# Patient Record
Sex: Female | Born: 1980
Health system: Southern US, Community
[De-identification: ages and names within clinical notes are randomized; demographics above are authoritative.]

## PROBLEM LIST (undated history)

## (undated) DIAGNOSIS — Z9889 Other specified postprocedural states: Secondary | ICD-10-CM

## (undated) DIAGNOSIS — R112 Nausea with vomiting, unspecified: Secondary | ICD-10-CM

## (undated) DIAGNOSIS — R002 Palpitations: Secondary | ICD-10-CM

## (undated) HISTORY — DX: Palpitations: R00.2

---
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
## 2005-08-14 ENCOUNTER — Other Ambulatory Visit: Admission: RE | Admit: 2005-08-14 | Discharge: 2005-08-14 | Payer: Self-pay | Admitting: Gynecology

## 2006-05-16 ENCOUNTER — Other Ambulatory Visit: Admission: RE | Admit: 2006-05-16 | Discharge: 2006-05-16 | Payer: Self-pay | Admitting: Gynecology

## 2006-12-08 ENCOUNTER — Inpatient Hospital Stay (HOSPITAL_COMMUNITY): Admission: AD | Admit: 2006-12-08 | Discharge: 2006-12-11 | Payer: Self-pay | Admitting: Gynecology

## 2006-12-09 ENCOUNTER — Encounter (INDEPENDENT_AMBULATORY_CARE_PROVIDER_SITE_OTHER): Payer: Self-pay | Admitting: *Deleted

## 2007-01-09 ENCOUNTER — Other Ambulatory Visit: Admission: RE | Admit: 2007-01-09 | Discharge: 2007-01-09 | Payer: Self-pay | Admitting: Gynecology

## 2008-01-14 ENCOUNTER — Other Ambulatory Visit: Admission: RE | Admit: 2008-01-14 | Discharge: 2008-01-14 | Payer: Self-pay | Admitting: Gynecology

## 2009-01-28 ENCOUNTER — Other Ambulatory Visit: Admission: RE | Admit: 2009-01-28 | Discharge: 2009-01-28 | Payer: Self-pay | Admitting: Gynecology

## 2009-01-28 ENCOUNTER — Encounter: Payer: Self-pay | Admitting: Gynecology

## 2009-01-28 ENCOUNTER — Ambulatory Visit: Payer: Self-pay | Admitting: Gynecology

## 2009-08-27 ENCOUNTER — Ambulatory Visit: Payer: Self-pay | Admitting: Gynecology

## 2009-09-17 ENCOUNTER — Ambulatory Visit: Payer: Self-pay | Admitting: Gynecology

## 2009-10-01 ENCOUNTER — Ambulatory Visit: Payer: Self-pay | Admitting: Gynecology

## 2009-11-19 ENCOUNTER — Inpatient Hospital Stay (HOSPITAL_COMMUNITY): Admission: AD | Admit: 2009-11-19 | Discharge: 2009-11-19 | Payer: Self-pay | Admitting: Obstetrics and Gynecology

## 2010-01-04 ENCOUNTER — Ambulatory Visit (HOSPITAL_COMMUNITY): Admission: RE | Admit: 2010-01-04 | Discharge: 2010-01-04 | Payer: Self-pay | Admitting: Obstetrics and Gynecology

## 2010-02-15 ENCOUNTER — Ambulatory Visit (HOSPITAL_COMMUNITY): Admission: RE | Admit: 2010-02-15 | Discharge: 2010-02-15 | Payer: Self-pay | Admitting: Obstetrics and Gynecology

## 2010-03-23 ENCOUNTER — Inpatient Hospital Stay (HOSPITAL_COMMUNITY): Admission: AD | Admit: 2010-03-23 | Discharge: 2010-03-23 | Payer: Self-pay | Admitting: Obstetrics and Gynecology

## 2010-03-24 ENCOUNTER — Inpatient Hospital Stay (HOSPITAL_COMMUNITY): Admission: AD | Admit: 2010-03-24 | Discharge: 2010-03-24 | Payer: Self-pay | Admitting: Obstetrics and Gynecology

## 2010-04-29 ENCOUNTER — Encounter (INDEPENDENT_AMBULATORY_CARE_PROVIDER_SITE_OTHER): Payer: Self-pay | Admitting: Obstetrics & Gynecology

## 2010-04-29 ENCOUNTER — Inpatient Hospital Stay (HOSPITAL_COMMUNITY): Admission: AD | Admit: 2010-04-29 | Discharge: 2010-05-01 | Payer: Self-pay | Admitting: Obstetrics and Gynecology

## 2010-12-31 LAB — CBC
HCT: 31.1 % — ABNORMAL LOW (ref 36.0–46.0)
HCT: 35.6 % — ABNORMAL LOW (ref 36.0–46.0)
Hemoglobin: 12.3 g/dL (ref 12.0–15.0)
MCHC: 34.6 g/dL (ref 30.0–36.0)
MCHC: 34.9 g/dL (ref 30.0–36.0)
MCV: 98.7 fL (ref 78.0–100.0)
MCV: 99.2 fL (ref 78.0–100.0)
RBC: 3.14 MIL/uL — ABNORMAL LOW (ref 3.87–5.11)
RBC: 3.61 MIL/uL — ABNORMAL LOW (ref 3.87–5.11)
RDW: 13.2 % (ref 11.5–15.5)
WBC: 10.6 10*3/uL — ABNORMAL HIGH (ref 4.0–10.5)

## 2010-12-31 LAB — RPR: RPR Ser Ql: NONREACTIVE

## 2011-01-04 LAB — URINALYSIS, ROUTINE W REFLEX MICROSCOPIC
Nitrite: NEGATIVE
Protein, ur: NEGATIVE mg/dL
Specific Gravity, Urine: <1.005 — ABNORMAL LOW (ref 1.005–1.030)
Urobilinogen, UA: 0.2 mg/dL (ref 0.0–1.0)
pH: 6.5 (ref 5.0–8.0)

## 2011-01-04 LAB — URINE MICROSCOPIC-ADD ON

## 2011-06-05 ENCOUNTER — Encounter: Payer: Self-pay | Admitting: *Deleted

## 2011-06-05 DIAGNOSIS — K5900 Constipation, unspecified: Secondary | ICD-10-CM | POA: Insufficient documentation

## 2011-06-05 DIAGNOSIS — Z6740 Type O blood, Rh positive: Secondary | ICD-10-CM | POA: Insufficient documentation

## 2011-06-12 ENCOUNTER — Encounter: Payer: Self-pay | Admitting: Gynecology

## 2011-10-17 NOTE — L&D Delivery Note (Signed)
Delivery Note At 5:13 PM a viable female was delivered via  OA Presentation  APGAR: 9 9 weight pending  Placenta status: spontaneously, intact with 3 vessel  cord Cord:  with the following complications: none  Anesthesia:  epidural Episiotomy: none Lacerations: none Suture Repair: none Est. Blood Loss (mL): 300  Mom to postpartum.  Baby to nursery-stable.  Connie Richardson 01/02/2012, 5:29 PM

## 2011-12-26 LAB — STREP B DNA PROBE: GBS: NEGATIVE

## 2012-01-02 ENCOUNTER — Encounter (HOSPITAL_COMMUNITY): Payer: Self-pay | Admitting: *Deleted

## 2012-01-02 ENCOUNTER — Inpatient Hospital Stay (HOSPITAL_COMMUNITY): Payer: Managed Care, Other (non HMO) | Admitting: Anesthesiology

## 2012-01-02 ENCOUNTER — Encounter (HOSPITAL_COMMUNITY): Payer: Self-pay | Admitting: Anesthesiology

## 2012-01-02 ENCOUNTER — Inpatient Hospital Stay (HOSPITAL_COMMUNITY)
Admission: AD | Admit: 2012-01-02 | Discharge: 2012-01-04 | DRG: 774 | Disposition: A | Discharge status: Home / Self Care | Payer: Managed Care, Other (non HMO) | Source: Ambulatory Visit | Attending: Obstetrics and Gynecology | Admitting: Obstetrics and Gynecology

## 2012-01-02 DIAGNOSIS — Z6740 Type O blood, Rh positive: Secondary | ICD-10-CM

## 2012-01-02 DIAGNOSIS — T4145XA Adverse effect of unspecified anesthetic, initial encounter: Secondary | ICD-10-CM | POA: Insufficient documentation

## 2012-01-02 HISTORY — DX: Nausea with vomiting, unspecified: R11.2

## 2012-01-02 HISTORY — DX: Other specified postprocedural states: Z98.890

## 2012-01-02 LAB — CBC
MCHC: 34.5 g/dL (ref 30.0–36.0)
Platelets: 254 10*3/uL (ref 150–400)
RDW: 13.8 % (ref 11.5–15.5)
WBC: 9.3 10*3/uL (ref 4.0–10.5)

## 2012-01-02 LAB — GC/CHLAMYDIA PROBE AMP, GENITAL
Chlamydia: NEGATIVE
Gonorrhea: NEGATIVE

## 2012-01-02 LAB — ANTIBODY SCREEN: Antibody Screen: NEGATIVE

## 2012-01-02 LAB — HIV ANTIBODY (ROUTINE TESTING W REFLEX): HIV: NONREACTIVE

## 2012-01-02 MED ORDER — IBUPROFEN 600 MG PO TABS
600.0000 mg | ORAL_TABLET | Freq: Four times a day (QID) | ORAL | Status: DC
  Administered 2012-01-02 – 2012-01-04 (×8): 600 mg via ORAL
  Filled 2012-01-02 (×8): qty 1

## 2012-01-02 MED ORDER — OXYTOCIN 20 UNITS IN LACTATED RINGERS INFUSION - SIMPLE: 125.0000 mL/h | Freq: Once | INTRAVENOUS | Status: DC

## 2012-01-02 MED ORDER — IBUPROFEN 600 MG PO TABS: 600.0000 mg | ORAL_TABLET | Freq: Four times a day (QID) | ORAL | Status: DC | PRN

## 2012-01-02 MED ORDER — FLEET ENEMA 7-19 GM/118ML RE ENEM: 1.0000 | ENEMA | Freq: Every day | RECTAL | Status: DC | PRN

## 2012-01-02 MED ORDER — ONDANSETRON HCL 4 MG PO TABS: 4.0000 mg | ORAL_TABLET | ORAL | Status: DC | PRN

## 2012-01-02 MED ORDER — LIDOCAINE HCL (PF) 1 % IJ SOLN
30.0000 mL | INTRAMUSCULAR | Status: DC | PRN
  Filled 2012-01-02: qty 30

## 2012-01-02 MED ORDER — PRENATAL MULTIVITAMIN CH
1.0000 | ORAL_TABLET | Freq: Every day | ORAL | Status: DC
  Administered 2012-01-03 – 2012-01-04 (×2): 1 via ORAL
  Filled 2012-01-02 (×2): qty 1

## 2012-01-02 MED ORDER — SIMETHICONE 80 MG PO CHEW: 80.0000 mg | CHEWABLE_TABLET | ORAL | Status: DC | PRN

## 2012-01-02 MED ORDER — LANOLIN HYDROUS EX OINT: TOPICAL_OINTMENT | CUTANEOUS | Status: DC | PRN

## 2012-01-02 MED ORDER — MEASLES, MUMPS & RUBELLA VAC ~~LOC~~ INJ
0.5000 mL | INJECTION | Freq: Once | SUBCUTANEOUS | Status: DC
  Filled 2012-01-02: qty 0.5

## 2012-01-02 MED ORDER — TETANUS-DIPHTH-ACELL PERTUSSIS 5-2.5-18.5 LF-MCG/0.5 IM SUSP: 0.5000 mL | Freq: Once | INTRAMUSCULAR | Status: DC

## 2012-01-02 MED ORDER — EPHEDRINE 5 MG/ML INJ
10.0000 mg | INTRAVENOUS | Status: DC | PRN
  Filled 2012-01-02: qty 4

## 2012-01-02 MED ORDER — LACTATED RINGERS IV SOLN: INTRAVENOUS | Status: DC

## 2012-01-02 MED ORDER — PHENYLEPHRINE 40 MCG/ML (10ML) SYRINGE FOR IV PUSH (FOR BLOOD PRESSURE SUPPORT): 80.0000 ug | PREFILLED_SYRINGE | INTRAVENOUS | Status: DC | PRN

## 2012-01-02 MED ORDER — BISACODYL 10 MG RE SUPP: 10.0000 mg | Freq: Every day | RECTAL | Status: DC | PRN

## 2012-01-02 MED ORDER — OXYTOCIN BOLUS FROM INFUSION
500.0000 mL | Freq: Once | INTRAVENOUS | Status: DC
  Filled 2012-01-02: qty 1000
  Filled 2012-01-02: qty 500

## 2012-01-02 MED ORDER — METHYLERGONOVINE MALEATE 0.2 MG PO TABS
0.2000 mg | ORAL_TABLET | ORAL | Status: AC | PRN
  Administered 2012-01-02 – 2012-01-03 (×6): 0.2 mg via ORAL
  Filled 2012-01-02 (×6): qty 1

## 2012-01-02 MED ORDER — LACTATED RINGERS IV SOLN: 500.0000 mL | Freq: Once | INTRAVENOUS | Status: DC

## 2012-01-02 MED ORDER — OXYCODONE-ACETAMINOPHEN 5-325 MG PO TABS
1.0000 | ORAL_TABLET | ORAL | Status: DC | PRN
  Administered 2012-01-02 – 2012-01-03 (×4): 1 via ORAL
  Filled 2012-01-02 (×4): qty 1

## 2012-01-02 MED ORDER — EPHEDRINE 5 MG/ML INJ: 10.0000 mg | INTRAVENOUS | Status: DC | PRN

## 2012-01-02 MED ORDER — SENNOSIDES-DOCUSATE SODIUM 8.6-50 MG PO TABS
2.0000 | ORAL_TABLET | Freq: Every day | ORAL | Status: DC
  Administered 2012-01-03: 2 via ORAL

## 2012-01-02 MED ORDER — CITRIC ACID-SODIUM CITRATE 334-500 MG/5ML PO SOLN: 30.0000 mL | ORAL | Status: DC | PRN

## 2012-01-02 MED ORDER — ONDANSETRON HCL 4 MG/2ML IJ SOLN: 4.0000 mg | Freq: Four times a day (QID) | INTRAMUSCULAR | Status: DC | PRN

## 2012-01-02 MED ORDER — FENTANYL 2.5 MCG/ML BUPIVACAINE 1/10 % EPIDURAL INFUSION (WH - ANES)
14.0000 mL/h | INTRAMUSCULAR | Status: DC
  Filled 2012-01-02: qty 60

## 2012-01-02 MED ORDER — OXYCODONE-ACETAMINOPHEN 5-325 MG PO TABS: 1.0000 | ORAL_TABLET | ORAL | Status: DC | PRN

## 2012-01-02 MED ORDER — ACETAMINOPHEN 325 MG PO TABS: 650.0000 mg | ORAL_TABLET | ORAL | Status: DC | PRN

## 2012-01-02 MED ORDER — DIPHENHYDRAMINE HCL 50 MG/ML IJ SOLN: 12.5000 mg | INTRAMUSCULAR | Status: DC | PRN

## 2012-01-02 MED ORDER — DIPHENHYDRAMINE HCL 25 MG PO CAPS: 25.0000 mg | ORAL_CAPSULE | Freq: Four times a day (QID) | ORAL | Status: DC | PRN

## 2012-01-02 MED ORDER — LACTATED RINGERS IV SOLN
Freq: Once | INTRAVENOUS | Status: AC
  Administered 2012-01-02: 21:00:00 via INTRAVENOUS

## 2012-01-02 MED ORDER — MEDROXYPROGESTERONE ACETATE 150 MG/ML IM SUSP: 150.0000 mg | INTRAMUSCULAR | Status: DC | PRN

## 2012-01-02 MED ORDER — ONDANSETRON HCL 4 MG/2ML IJ SOLN: 4.0000 mg | INTRAMUSCULAR | Status: DC | PRN

## 2012-01-02 MED ORDER — FENTANYL 2.5 MCG/ML BUPIVACAINE 1/10 % EPIDURAL INFUSION (WH - ANES)
INTRAMUSCULAR | Status: DC | PRN
  Administered 2012-01-02: 14 mL/h via EPIDURAL

## 2012-01-02 MED ORDER — LACTATED RINGERS IV SOLN: 500.0000 mL | INTRAVENOUS | Status: DC | PRN

## 2012-01-02 MED ORDER — FLEET ENEMA 7-19 GM/118ML RE ENEM: 1.0000 | ENEMA | RECTAL | Status: DC | PRN

## 2012-01-02 MED ORDER — WITCH HAZEL-GLYCERIN EX PADS: 1.0000 "application " | MEDICATED_PAD | CUTANEOUS | Status: DC | PRN

## 2012-01-02 MED ORDER — BENZOCAINE-MENTHOL 20-0.5 % EX AERO
1.0000 "application " | INHALATION_SPRAY | CUTANEOUS | Status: DC | PRN
  Administered 2012-01-02: 1 via TOPICAL

## 2012-01-02 MED ORDER — ZOLPIDEM TARTRATE 5 MG PO TABS: 5.0000 mg | ORAL_TABLET | Freq: Every evening | ORAL | Status: DC | PRN

## 2012-01-02 MED ORDER — OXYTOCIN 20 UNITS IN LACTATED RINGERS INFUSION - SIMPLE
INTRAVENOUS | Status: AC
  Filled 2012-01-02: qty 1000

## 2012-01-02 MED ORDER — DIBUCAINE 1 % RE OINT: 1.0000 "application " | TOPICAL_OINTMENT | RECTAL | Status: DC | PRN

## 2012-01-02 MED ORDER — PHENYLEPHRINE 40 MCG/ML (10ML) SYRINGE FOR IV PUSH (FOR BLOOD PRESSURE SUPPORT)
80.0000 ug | PREFILLED_SYRINGE | INTRAVENOUS | Status: DC | PRN
  Filled 2012-01-02: qty 5

## 2012-01-02 MED ORDER — METHYLERGONOVINE MALEATE 0.2 MG/ML IJ SOLN: 0.2000 mg | INTRAMUSCULAR | Status: AC | PRN

## 2012-01-02 MED ORDER — BENZOCAINE-MENTHOL 20-0.5 % EX AERO
INHALATION_SPRAY | CUTANEOUS | Status: AC
  Filled 2012-01-02: qty 56

## 2012-01-02 MED ORDER — LIDOCAINE HCL (PF) 1 % IJ SOLN
INTRAMUSCULAR | Status: DC | PRN
  Administered 2012-01-02 (×2): 4 mL

## 2012-01-02 NOTE — MAU Note (Signed)
Pt started having irregular contractions since Sunday.  Pt has had bloody show this morning when wiping and feels a lot of pressure.  Pt denies any ROM.  Good Fetal movement.

## 2012-01-02 NOTE — Anesthesia Preprocedure Evaluation (Signed)
Anesthesia Evaluation  Patient identified by MRN, date of birth, ID band Patient awake    Reviewed: Allergy & Precautions, H&P , Patient's Chart, lab work & pertinent test results  History of Anesthesia Complications (+) PONV  Airway Mallampati: II TM Distance: >3 FB Neck ROM: full    Dental No notable dental hx. (+) Teeth Intact   Pulmonary neg pulmonary ROS,  breath sounds clear to auscultation  Pulmonary exam normal       Cardiovascular negative cardio ROS  Rhythm:regular Rate:Normal     Neuro/Psych negative neurological ROS  negative psych ROS   GI/Hepatic negative GI ROS, Neg liver ROS,   Endo/Other  negative endocrine ROS  Renal/GU negative Renal ROS  negative genitourinary   Musculoskeletal   Abdominal Normal abdominal exam  (+)   Peds  Hematology negative hematology ROS (+)   Anesthesia Other Findings   Reproductive/Obstetrics (+) Pregnancy                           Anesthesia Physical Anesthesia Plan  ASA: II  Anesthesia Plan: Epidural   Post-op Pain Management:    Induction:   Airway Management Planned:   Additional Equipment:   Intra-op Plan:   Post-operative Plan:   Informed Consent: I have reviewed the patients History and Physical, chart, labs and discussed the procedure including the risks, benefits and alternatives for the proposed anesthesia with the patient or authorized representative who has indicated his/her understanding and acceptance.     Plan Discussed with: Anesthesiologist and Surgeon  Anesthesia Plan Comments:         Anesthesia Quick Evaluation

## 2012-01-02 NOTE — H&P (Signed)
31 year old G 4 P 3 at 37 weeks admitted in active labor. PNC is uncomplicated. History of NSVD x 3  She was admitted and received her epidural   Afebrile  Vital signs stable General alert and oriented LUNG CTAB Car RRR Abdomen is soft and non tender Cervix now c/c/+3  Late entry  IMPRESSION: IUP at 37 weeks Labor  PLAN: Anticipate NSVD

## 2012-01-02 NOTE — Progress Notes (Signed)
Called to see patient for post partum hemorrhage. She was noted by nurse to have uterine atony  - passed a large amount of clots in the bathroom and fainted. The nurse immediately called me and over the telephone I ordered methergine, pitocin, fluid bolus. Once I arrived, patient in bed and was alert and oriented.  Vital signs are stable Uterus now firm and with my exploration only small clots expressed. I believe she is now responding to the methergine. Will continue to monitor closely tonight. Complete methergine series.

## 2012-01-02 NOTE — Anesthesia Procedure Notes (Signed)
Epidural Patient location during procedure: OB Start time: 01/02/2012 4:18 PM  Staffing Anesthesiologist: Kashena Novitski A. Performed by: anesthesiologist   Preanesthetic Checklist Completed: patient identified, site marked, surgical consent, pre-op evaluation, timeout performed, IV checked, risks and benefits discussed and monitors and equipment checked  Epidural Patient position: sitting Prep: site prepped and draped and DuraPrep Patient monitoring: continuous pulse ox and blood pressure Approach: midline Injection technique: LOR air  Needle:  Needle type: Tuohy  Needle gauge: 17 G Needle length: 9 cm Needle insertion depth: 5 cm cm Catheter type: closed end flexible Catheter size: 19 Gauge Catheter at skin depth: 10 cm Test dose: negative and Other  Assessment Events: blood not aspirated, injection not painful, no injection resistance, negative IV test and no paresthesia  Additional Notes Patient identified. Risks and benefits discussed including failed block, incomplete  Pain control, post dural puncture headache, nerve damage, paralysis, blood pressure Changes, nausea, vomiting, reactions to medications-both toxic and allergic and post Partum back pain. All questions were answered. Patient expressed understanding and wished to proceed. Sterile technique was used throughout procedure. Epidural site was Dressed with sterile barrier dressing. No paresthesias, signs of intravascular injection Or signs of intrathecal spread were encountered.  Patient was more comfortable after the epidural was dosed. Please see RN's note for documentation of vital signs and FHR which are stable.

## 2012-01-03 LAB — CBC
Hemoglobin: 10.2 g/dL — ABNORMAL LOW (ref 12.0–15.0)
MCH: 32.2 pg (ref 26.0–34.0)
MCHC: 33.4 g/dL (ref 30.0–36.0)
Platelets: 202 10*3/uL (ref 150–400)
RDW: 13.8 % (ref 11.5–15.5)

## 2012-01-03 LAB — RPR: RPR Ser Ql: NONREACTIVE

## 2012-01-03 NOTE — Anesthesia Postprocedure Evaluation (Signed)
  Anesthesia Post-op Note  Patient: Connie Richardson  Procedure(s) Performed: * No procedures listed *  Patient Location: Mother/Baby  Anesthesia Type: Epidural  Level of Consciousness: awake, alert  and oriented  Airway and Oxygen Therapy: Patient Spontanous Breathing  Post-op Pain: none  Post-op Assessment: Post-op Vital signs reviewed, Patient's Cardiovascular Status Stable, No headache, No backache, No residual numbness and No residual motor weakness  Post-op Vital Signs: Reviewed and stable  Complications: No apparent anesthesia complications

## 2012-01-03 NOTE — Progress Notes (Signed)
Post Partum Day 1 Subjective: no complaints, up ad lib, voiding, tolerating PO and states had pp hemorrhage yesterday. No further bleeding . States uterus sore  Objective: Blood pressure 103/66, pulse 73, temperature 98.6 F (37 C), temperature source Oral, resp. rate 18, height 5' 4.25" (1.632 m), weight 79.017 kg (174 lb 3.2 oz), SpO2 97.00%, unknown if currently breastfeeding.  Physical Exam:  General: alert and cooperative Lochia: appropriate Uterine Fundus: firm Incision: perineum intact DVT Evaluation: No evidence of DVT seen on physical exam.   Basename 01/03/12 0606 01/02/12 1555  HGB 10.2* 12.8  HCT 30.5* 37.1    Assessment/Plan: Plan for discharge tomorrow   LOS: 1 day   Estelene Carmack G 01/03/2012, 8:01 AM

## 2012-01-04 MED ORDER — IBUPROFEN 600 MG PO TABS: 600.0000 mg | ORAL_TABLET | Freq: Four times a day (QID) | ORAL | Status: AC

## 2012-01-04 NOTE — Progress Notes (Signed)
Post Partum Day 2 Subjective: no complaints, up ad lib, voiding, tolerating PO, + flatus and heavy vag bleeding resolved  Objective: Blood pressure 99/63, pulse 67, temperature 98.2 F (36.8 C), temperature source Oral, resp. rate 18, height 5' 4.25" (1.632 m), weight 79.017 kg (174 lb 3.2 oz), SpO2 97.00%, unknown if currently breastfeeding.  Physical Exam:  General: alert and cooperative Lochia: appropriate Uterine Fundus: firm Incision: perineum intact DVT Evaluation: No evidence of DVT seen on physical exam.   Basename 01/03/12 0606 01/02/12 1555  HGB 10.2* 12.8  HCT 30.5* 37.1    Assessment/Plan: Discharge home   LOS: 2 days   Yaron Grasse G 01/04/2012, 8:00 AM

## 2012-01-04 NOTE — Discharge Summary (Signed)
Obstetric Discharge Summary Reason for Admission: onset of labor Prenatal Procedures: ultrasound Intrapartum Procedures: spontaneous vaginal delivery Postpartum Procedures: none Complications-Operative and Postpartum: none and hemorrhage Hemoglobin  Date Value Range Status  01/03/2012 10.2* 12.0-15.0 (g/dL) Final     DELTA CHECK NOTED     REPEATED TO VERIFY     HCT  Date Value Range Status  01/03/2012 30.5* 36.0-46.0 (%) Final    Physical Exam:  General: alert and cooperative Lochia: appropriate Uterine Fundus: firm Incision: perineum intact DVT Evaluation: No evidence of DVT seen on physical exam.  Discharge Diagnoses: Term Pregnancy-delivered  Discharge Information: Date: 01/04/2012 Activity: pelvic rest Diet: routine Medications: PNV and Ibuprofen Condition: stable Instructions: refer to practice specific booklet Discharge to: home   Newborn Data: Live born female  Birth Weight: 5 lb 14.8 oz (2688 g) APGAR: 8, 9  Home with mother.  Malik Paar G 01/04/2012, 8:07 AM

## 2012-12-10 ENCOUNTER — Other Ambulatory Visit (HOSPITAL_COMMUNITY): Payer: Self-pay | Admitting: Obstetrics and Gynecology

## 2012-12-10 DIAGNOSIS — Z09 Encounter for follow-up examination after completed treatment for conditions other than malignant neoplasm: Secondary | ICD-10-CM

## 2013-01-02 ENCOUNTER — Ambulatory Visit (HOSPITAL_COMMUNITY)
Admission: RE | Admit: 2013-01-02 | Discharge: 2013-01-02 | Disposition: A | Discharge status: Home / Self Care | Payer: Managed Care, Other (non HMO) | Source: Ambulatory Visit | Attending: Obstetrics and Gynecology | Admitting: Obstetrics and Gynecology

## 2013-01-02 DIAGNOSIS — Z3049 Encounter for surveillance of other contraceptives: Secondary | ICD-10-CM | POA: Insufficient documentation

## 2013-01-02 MED ORDER — IOHEXOL 300 MG/ML  SOLN
20.0000 mL | Freq: Once | INTRAMUSCULAR | Status: AC | PRN
  Administered 2013-01-02: 2 mL

## 2014-01-10 ENCOUNTER — Emergency Department (INDEPENDENT_AMBULATORY_CARE_PROVIDER_SITE_OTHER): Payer: 59

## 2014-01-10 ENCOUNTER — Emergency Department (INDEPENDENT_AMBULATORY_CARE_PROVIDER_SITE_OTHER)
Admission: EM | Admit: 2014-01-10 | Discharge: 2014-01-10 | Disposition: A | Discharge status: Home / Self Care | Payer: 59 | Source: Home / Self Care | Attending: Emergency Medicine | Admitting: Emergency Medicine

## 2014-01-10 ENCOUNTER — Encounter (HOSPITAL_COMMUNITY): Payer: Self-pay | Admitting: Emergency Medicine

## 2014-01-10 DIAGNOSIS — W19XXXA Unspecified fall, initial encounter: Secondary | ICD-10-CM

## 2014-01-10 DIAGNOSIS — S5000XA Contusion of unspecified elbow, initial encounter: Secondary | ICD-10-CM

## 2014-01-10 MED ORDER — HYDROMORPHONE HCL PF 1 MG/ML IJ SOLN
INTRAMUSCULAR | Status: AC
  Filled 2014-01-10: qty 2

## 2014-01-10 MED ORDER — HYDROCODONE-ACETAMINOPHEN 5-325 MG PO TABS: ORAL_TABLET | ORAL | Status: DC

## 2014-01-10 MED ORDER — HYDROMORPHONE HCL PF 1 MG/ML IJ SOLN
2.0000 mg | Freq: Once | INTRAMUSCULAR | Status: AC
  Administered 2014-01-10: 2 mg via INTRAMUSCULAR

## 2014-01-10 NOTE — Discharge Instructions (Signed)
Cast or Splint Care  Casts and splints support injured limbs and keep bones from moving while they heal. It is important to care for your cast or splint at home.   HOME CARE INSTRUCTIONS   Keep the cast or splint uncovered during the drying period. It can take 24 to 48 hours to dry if it is made of plaster. A fiberglass cast will dry in less than 1 hour.   Do not rest the cast on anything harder than a pillow for the first 24 hours.   Do not put weight on your injured limb or apply pressure to the cast until your health care provider gives you permission.   Keep the cast or splint dry. Wet casts or splints can lose their shape and may not support the limb as well. A wet cast that has lost its shape can also create harmful pressure on your skin when it dries. Also, wet skin can become infected.   Cover the cast or splint with a plastic bag when bathing or when out in the rain or snow. If the cast is on the trunk of the body, take sponge baths until the cast is removed.   If your cast does become wet, dry it with a towel or a blow dryer on the cool setting only.   Keep your cast or splint clean. Soiled casts may be wiped with a moistened cloth.   Do not place any hard or soft foreign objects under your cast or splint, such as cotton, toilet paper, lotion, or powder.   Do not try to scratch the skin under the cast with any object. The object could get stuck inside the cast. Also, scratching could lead to an infection. If itching is a problem, use a blow dryer on a cool setting to relieve discomfort.   Do not trim or cut your cast or remove padding from inside of it.   Exercise all joints next to the injury that are not immobilized by the cast or splint. For example, if you have a long leg cast, exercise the hip joint and toes. If you have an arm cast or splint, exercise the shoulder, elbow, thumb, and fingers.   Elevate your injured arm or leg on 1 or 2 pillows for the first 1 to 3 days to decrease  swelling and pain.It is best if you can comfortably elevate your cast so it is higher than your heart.  SEEK MEDICAL CARE IF:    Your cast or splint cracks.   Your cast or splint is too tight or too loose.   You have unbearable itching inside the cast.   Your cast becomes wet or develops a soft spot or area.   You have a bad smell coming from inside your cast.   You get an object stuck under your cast.   Your skin around the cast becomes red or raw.   You have new pain or worsening pain after the cast has been applied.  SEEK IMMEDIATE MEDICAL CARE IF:    You have fluid leaking through the cast.   You are unable to move your fingers or toes.   You have discolored (blue or white), cool, painful, or very swollen fingers or toes beyond the cast.   You have tingling or numbness around the injured area.   You have severe pain or pressure under the cast.   You have any difficulty with your breathing or have shortness of breath.   You have chest 

## 2014-01-10 NOTE — ED Notes (Signed)
Waiting for ortho tech 

## 2014-01-10 NOTE — ED Notes (Addendum)
Pt c/o right arm/elbow pain around 1630 Reports she was roller skating; fell backwards Pain increases w/activity and is constant Alert w/no signs of acute distress.

## 2014-01-10 NOTE — ED Provider Notes (Signed)
Chief Complaint    Chief Complaint  Patient presents with  . Arm Pain    History of Present Illness     Connie Richardson is a 33 year old female who was at a rollerskating party for her daughter today when she fell backwards, catching her weight on both arms. Ever since then she's had pain in the right elbow. There is no swelling or bruising. It hurts to extend or flex. She's had no numbness or tingling.  Review of Systems     Other than as noted above, the patient denies any of the following symptoms: Systemic:  No fevers, chills, sweats, or muscle aches.  No weight loss.  Musculoskeletal:  No joint pain, arthritis, bursitis, swelling, back pain, or neck pain. Neurological:  No muscular weakness, paresthesias, headache, or trouble with speech or coordination.  No dizziness.  [place]    Past medical history, family history, social history, meds, and allergies were reviewed.  Her only medication is Lexapro.  Physical Exam    Vital signs:  BP 111/77  Pulse 106  Temp(Src) 98.1 F (36.7 C) (Oral)  Resp 18  SpO2 100%  LMP 01/10/2014 Gen:  Alert and oriented times 3.  In no distress. Musculoskeletal: There is pain to palpation over the elbow both medially and laterally. No swelling, bruising, or deformity. Her elbow has limited range of motion with pain both on full flexion and full extension.  Otherwise, all joints had a full a ROM with no swelling, bruising or deformity.  No edema, pulses full. Extremities were warm and pink.  Capillary refill was brisk.  Skin:  Clear, warm and dry.  No rash. Neuro:  Alert and oriented times 3.  Muscle strength was normal.  Sensation was intact to light touch.    Radiology     Dg Elbow Complete Right  01/10/2014   CLINICAL DATA:  Arm pain.  EXAM: RIGHT ELBOW - COMPLETE 3+ VIEW  COMPARISON:  None.  FINDINGS: There is a right elbow joint effusion. No visible fracture, but the joint effusion is concerning for occult fracture. No subluxation or  dislocation.  IMPRESSION: Joint effusion without visible fracture. Findings concerning for occult fracture. Consider immobilization and repeat imaging if symptoms persist.   Electronically Signed   By: Rolm Baptise M.D.   On: 01/10/2014 20:10   I reviewed the images independently and personally and concur with the radiologist's findings.  Course in [facility] [place]   She will be placed in a posterior splint and given a sling. She is to followup with orthopedics next week. Given Norco 5/325 2 by mouth for pain.  Assessment    The encounter diagnosis was Elbow contusion.  Possible occult radial head fracture.  Plan   1.  Meds:  The following meds were prescribed:   New Prescriptions   HYDROCODONE-ACETAMINOPHEN (NORCO/VICODIN) 5-325 MG PER TABLET    1 to 2 tabs every 4 to 6 hours as needed for pain.    2.  Patient Education/Counseling:  The patient was given appropriate handouts, self care instructions, and instructed in symptomatic relief, including rest and activity, elevation, application of ice and compression.    3.  Follow up:  The patient was told to follow up here if no better in 3 to 4 days, or sooner if becoming worse in any way, and given some red flag symptoms such as worsening pain or new neurological symptoms which would prompt immediate return.  Follow up with Dr. Erlinda Hong within the next week.  Harden Mo, MD 01/10/14 [phone]

## 2014-01-10 NOTE — Progress Notes (Signed)
Orthopedic Tech Progress Note Patient Details:  Connie Richardson Mar 03, 1981 [id number]  Ortho Devices Type of Ortho Device: Ace wrap;Arm sling;Long arm splint Ortho Device/Splint Location: rue Ortho Device/Splint Interventions: Application   Smiley Birr 01/10/2014, 9:30 PM

## 2014-08-17 ENCOUNTER — Encounter (HOSPITAL_COMMUNITY): Payer: Self-pay | Admitting: Emergency Medicine

## 2015-03-01 ENCOUNTER — Other Ambulatory Visit: Payer: Self-pay | Admitting: Obstetrics and Gynecology

## 2015-03-02 LAB — CYTOLOGY - PAP

## 2015-05-05 ENCOUNTER — Encounter (HOSPITAL_COMMUNITY): Payer: Self-pay | Admitting: *Deleted

## 2015-05-05 ENCOUNTER — Emergency Department (HOSPITAL_COMMUNITY)
Admission: EM | Admit: 2015-05-05 | Discharge: 2015-05-06 | Disposition: A | Discharge status: Home / Self Care | Payer: 59 | Attending: Emergency Medicine | Admitting: Emergency Medicine

## 2015-05-05 DIAGNOSIS — I493 Ventricular premature depolarization: Secondary | ICD-10-CM | POA: Diagnosis not present

## 2015-05-05 DIAGNOSIS — Z79899 Other long term (current) drug therapy: Secondary | ICD-10-CM | POA: Diagnosis not present

## 2015-05-05 DIAGNOSIS — Z6740 Type O blood, Rh positive: Secondary | ICD-10-CM | POA: Insufficient documentation

## 2015-05-05 DIAGNOSIS — Z3202 Encounter for pregnancy test, result negative: Secondary | ICD-10-CM | POA: Insufficient documentation

## 2015-05-05 DIAGNOSIS — R002 Palpitations: Secondary | ICD-10-CM | POA: Diagnosis present

## 2015-05-05 NOTE — ED Notes (Signed)
Pt states she felt lethargic and fluttering in her chest starting yesterday. Pt denies chest pain. Pt states she is feeling slightly short of breath and lightheaded at this time. Pt states she has had diarrhea for the past 2 days, denies n/v, dizziness, weakness. Pt denies cardiac history. Pt has family history of hyperthyroidism.

## 2015-05-06 LAB — BASIC METABOLIC PANEL
Anion gap: 9 (ref 5–15)
BUN: 15 mg/dL (ref 6–20)
CO2: 28 mmol/L (ref 22–32)
CREATININE: 0.73 mg/dL (ref 0.44–1.00)
Calcium: 9.1 mg/dL (ref 8.9–10.3)
Chloride: 104 mmol/L (ref 101–111)
GFR calc Af Amer: >60 mL/min (ref >60)
Glucose, Bld: 91 mg/dL (ref 65–99)
Potassium: 3.8 mmol/L (ref 3.5–5.1)
Sodium: 141 mmol/L (ref 135–145)

## 2015-05-06 LAB — CBC
HCT: 38.9 % (ref 36.0–46.0)
Hemoglobin: 12.9 g/dL (ref 12.0–15.0)
MCH: 32.7 pg (ref 26.0–34.0)
MCHC: 33.2 g/dL (ref 30.0–36.0)
MCV: 98.5 fL (ref 78.0–100.0)
PLATELETS: 263 10*3/uL (ref 150–400)
RBC: 3.95 MIL/uL (ref 3.87–5.11)
RDW: 12.3 % (ref 11.5–15.5)
WBC: 5.6 10*3/uL (ref 4.0–10.5)

## 2015-05-06 LAB — POC URINE PREG, ED: Preg Test, Ur: NEGATIVE

## 2015-05-06 LAB — TROPONIN I

## 2015-05-06 NOTE — Discharge Instructions (Signed)

## 2015-05-06 NOTE — ED Provider Notes (Signed)
CSN: [id number]     Arrival date & time 05/05/15  2251 History  This chart was scribed for Debby Freiberg, MD by Randa Evens, ED Scribe. This patient was seen in room WA14/WA14 and the patient's care was started at 1:05 AM.      Chief Complaint  Patient presents with  . Palpitations   Patient is a 34 y.o. female presenting with palpitations. The history is provided by the patient. No language interpreter was used.  Palpitations Palpitations quality:  Irregular Onset quality:  At rest Duration:  1 day Timing:  Constant Chronicity:  New Relieved by:  Nothing Ineffective treatments:  Bed rest Associated symptoms: no chest pain, no lower extremity edema, no nausea, no shortness of breath and no vomiting    HPI Comments: Connie Richardson is a 34 y.o. female who presents to the Emergency Department complaining of new constant palpitations onset 1 day prior. Pt states that she has been feeling more lethargic as well. Pt states she has tried resting and lying down with no relief. Pt does report some diarrhea for the past 2 days. Pt denies nausea, vomiting, SOB, leg swelling or unexpected weight loss. Pt does report Hx of anxiety. Pt denies any new medications. Pt denies any changes in her caffeine intake.     Past Medical History  Diagnosis Date  . Blood type O+   . Constipation   . Complication of anesthesia   . PONV (postoperative nausea and vomiting)    Past Surgical History  Procedure Laterality Date  . Cervix lesion destruction  1999    IN [place], PT REPORTED   Family History  Problem Relation Age of Onset  . Breast cancer Paternal Grandmother   . Anesthesia problems Neg Hx    History  Substance Use Topics  . Smoking status: Never Smoker   . Smokeless tobacco: Never Used  . Alcohol Use: Yes     Comment: MINIMAL   OB History    Gravida Para Term Preterm AB TAB SAB Ectopic Multiple Living   4 4 4  0 0 0 0 0 0 4      Review of Systems  Constitutional: Negative  for unexpected weight change.  Respiratory: Negative for shortness of breath.   Cardiovascular: Positive for palpitations. Negative for chest pain.  Gastrointestinal: Negative for nausea and vomiting.  All other systems reviewed and are negative.    Allergies  Review of patient's allergies indicates no known allergies.  Home Medications   Prior to Admission medications   Medication Sig Start Date End Date Taking? Authorizing Provider  doxylamine, Sleep, (UNISOM) 25 MG tablet Take 25 mg by mouth at bedtime as needed for sleep.   Yes Historical Provider, MD  escitalopram (LEXAPRO) 10 MG tablet Take 10 mg by mouth daily.   Yes Historical Provider, MD  ibuprofen (ADVIL,MOTRIN) 200 MG tablet Take 400 mg by mouth every 6 (six) hours as needed for moderate pain.   Yes Historical Provider, MD  Multiple Vitamin (MULTIVITAMIN WITH MINERALS) TABS tablet Take 1 tablet by mouth daily.   Yes Historical Provider, MD  HYDROcodone-acetaminophen (NORCO/VICODIN) 5-325 MG per tablet 1 to 2 tabs every 4 to 6 hours as needed for pain. Patient not taking: Reported on 05/05/2015 01/10/14   Harden Mo, MD   BP 105/72 mmHg  Pulse 68  Temp(Src) 98.1 F (36.7 C) (Oral)  Resp 14  SpO2 99%  LMP 04/28/2015   Physical Exam  Constitutional: She is oriented to person, place, and  time. She appears well-developed and well-nourished.  HENT:  Head: Normocephalic and atraumatic.  Right Ear: External ear normal.  Left Ear: External ear normal.  Eyes: Conjunctivae and EOM are normal. Pupils are equal, round, and reactive to light.  Neck: Normal range of motion. Neck supple.  Cardiovascular: Normal rate, regular rhythm, normal heart sounds and intact distal pulses.   Pulmonary/Chest: Effort normal and breath sounds normal.  Abdominal: Soft. Bowel sounds are normal. There is no tenderness.  Musculoskeletal: Normal range of motion.  Neurological: She is alert and oriented to person, place, and time.  Skin: Skin is  warm and dry.  Vitals reviewed.   ED Course  Procedures (including critical care time) DIAGNOSTIC STUDIES: Oxygen Saturation is 99% on RA, normal by my interpretation.    COORDINATION OF CARE: 1:12 AM-Discussed treatment plan with pt at bedside and pt agreed to plan.     Labs Review Labs Reviewed  BASIC METABOLIC PANEL  CBC  TROPONIN I  POC URINE PREG, ED    Imaging Review No results found.   EKG Interpretation   Date/Time:  Wednesday May 05 2015 23:02:03 EDT Ventricular Rate:  67 PR Interval:    QRS Duration: 99 QT Interval:  382 QTC Calculation: 403 R Axis:   77 Text Interpretation:  Normal sinus rhythm No old tracing to compare  Confirmed by Debby Freiberg [phone]) on 05/06/2015 1:08:21 AM      MDM   Final diagnoses:  Palpitations  PVC (premature ventricular contraction)      34 y.o. female without pertinent PMH presents with palpitations.  Physical exam benign.  The patient had one episode of palpitations during my exam, her cardiac monitor demonstrated a PVC. This is likely etiology of her symptoms. She has no swelling of her lower extremityies or pain, no recent surgeries, no history of DVT, no dyspnea. DC home to follow-up with PCP..    I have reviewed all laboratory and imaging studies if ordered as above  1. Palpitations   2. PVC (premature ventricular contraction)           Debby Freiberg, MD 05/06/15 [phone]

## 2015-05-10 ENCOUNTER — Telehealth: Payer: Self-pay | Admitting: Cardiovascular Disease

## 2015-05-10 NOTE — Telephone Encounter (Signed)
Received records from [place] for appointment on 05/14/15 with Dr Gwenlyn Found.  Records given to [facility] (medical records) for Dr Kennon Holter schedule on 05/14/15. lp

## 2015-05-14 ENCOUNTER — Ambulatory Visit: Payer: 59 | Admitting: Cardiovascular Disease

## 2015-05-18 ENCOUNTER — Ambulatory Visit (INDEPENDENT_AMBULATORY_CARE_PROVIDER_SITE_OTHER): Payer: 59 | Admitting: Cardiovascular Disease

## 2015-05-18 ENCOUNTER — Encounter: Payer: Self-pay | Admitting: Cardiovascular Disease

## 2015-05-18 VITALS — BP 88/62 | HR 94 | Ht 64.5 in | Wt 156.5 lb

## 2015-05-18 DIAGNOSIS — R002 Palpitations: Secondary | ICD-10-CM

## 2015-05-18 NOTE — Patient Instructions (Signed)
Your physician wants you to follow-up in: 3 months with Dr Berry. You will receive a reminder letter in the mail two months in advance. If you don't receive a letter, please call our office to schedule the follow-up appointment.  

## 2015-05-18 NOTE — Assessment & Plan Note (Signed)
Connie Richardson was referred by her primary care physician, Dr. Martinique, for evaluation of new onset palpitations. These began on 7/18, increased in frequency resulting in an ER visit. She was noted to have PVCs. Lab work was normal. Her thyroid function tests were subsequently normal. She was told to stop caffeine intake which she complied with. Her palpitations have resolved over the last 48 hours. She denies chest pain or shortness of breath and she has used no other stimulants. At this point, I do not feel inclined to order a 2-D echo or event monitor however should her symptoms recur these would be tests that I would order an follow-up on as an outpatient.

## 2015-05-18 NOTE — Progress Notes (Signed)
05/18/2015 Connie Richardson   1981-05-18  [id number]  Primary Physician No primary care provider on file. Primary Cardiologist: Lorretta Harp MD Renae Gloss   HPI:  Connie Richardson is a delightful 34 year old mildly overweight married Caucasian female mother of 4 children referred by Dr. Martinique for cardiovascular evaluation because of new onset palpitations. She has no cardiovascular risk factors. She [place]ed on [place] in mid [place] and noticed the onset of palpitations several days after her return on 7/18. These increased in frequency and severity prompting her to go to the emergency room on 7/19 where she was noted to have PVCs on telemetry. Her workup otherwise is negative. Thyroid function tests and other lab work were unremarkable. She was told to stop caffeine intake which she complied with. Her palpitations have resolved over the last 48 hours. She denies chest pain or shortness of breath.   Current Outpatient Prescriptions  Medication Sig Dispense Refill  . escitalopram (LEXAPRO) 10 MG tablet Take 10 mg by mouth daily.    Marland Kitchen ibuprofen (ADVIL,MOTRIN) 200 MG tablet Take 400 mg by mouth every 6 (six) hours as needed for moderate pain.    . Multiple Vitamin (MULTIVITAMIN WITH MINERALS) TABS tablet Take 1 tablet by mouth daily.    . Omega-3 Fatty Acids (FISH OIL BURP-LESS PO) Take 1 capsule by mouth daily.     No current facility-administered medications for this visit.    No Known Allergies  History   Social History  . Marital Status: Married    Spouse Name: N/A  . Number of Children: N/A  . Years of Education: N/A   Occupational History  . Not on file.   Social History Main Topics  . Smoking status: Never Smoker   . Smokeless tobacco: Never Used  . Alcohol Use: 2.4 oz/week    4 Standard drinks or equivalent per week     Comment: MINIMAL  . Drug Use: No  . Sexual Activity: Yes    Birth Control/ Protection: None   Other Topics Concern  . Not on  file   Social History Narrative     Review of Systems: General: negative for chills, fever, night sweats or weight changes.  Cardiovascular: negative for chest pain, dyspnea on exertion, edema, orthopnea, palpitations, paroxysmal nocturnal dyspnea or shortness of breath Dermatological: negative for rash Respiratory: negative for cough or wheezing Urologic: negative for hematuria Abdominal: negative for nausea, vomiting, diarrhea, bright red blood per rectum, melena, or hematemesis Neurologic: negative for visual changes, syncope, or dizziness All other systems reviewed and are otherwise negative except as noted above.    Blood pressure 88/62, pulse 94, height 5' 4.5" (1.638 m), weight 156 lb 8 oz (70.988 kg), last menstrual period 04/28/2015.  General appearance: alert and no distress Neck: no adenopathy, no carotid bruit, no JVD, supple, symmetrical, trachea midline and thyroid not enlarged, symmetric, no tenderness/mass/nodules Lungs: clear to auscultation bilaterally Heart: regular rate and rhythm, S1, S2 normal, no murmur, click, rub or gallop Extremities: extremities normal, atraumatic, no cyanosis or edema  EKG normal sinus rhythm at 94 without ST or T-wave changes. I personally reviewed this EKG.  ASSESSMENT AND PLAN:   Palpitations Connie Richardson was referred by her primary care physician, Dr. Martinique, for evaluation of new onset palpitations. These began on 7/18, increased in frequency resulting in an ER visit. She was noted to have PVCs. Lab work was normal. Her thyroid function tests were subsequently normal. She was told to stop caffeine intake  which she complied with. Her palpitations have resolved over the last 48 hours. She denies chest pain or shortness of breath and she has used no other stimulants. At this point, I do not feel inclined to order a 2-D echo or event monitor however should her symptoms recur these would be tests that I would order an follow-up on as an  outpatient.      Lorretta Harp MD FACP,FACC,FAHA, [facility] 05/18/2015 2:17 PM

## 2015-06-09 ENCOUNTER — Ambulatory Visit (INDEPENDENT_AMBULATORY_CARE_PROVIDER_SITE_OTHER): Payer: 59

## 2015-06-09 ENCOUNTER — Encounter: Payer: Self-pay | Admitting: Cardiovascular Disease

## 2015-06-09 ENCOUNTER — Ambulatory Visit (INDEPENDENT_AMBULATORY_CARE_PROVIDER_SITE_OTHER): Payer: 59 | Admitting: Cardiovascular Disease

## 2015-06-09 VITALS — BP 86/60 | HR 72 | Ht 64.5 in | Wt 159.0 lb

## 2015-06-09 DIAGNOSIS — R002 Palpitations: Secondary | ICD-10-CM | POA: Diagnosis not present

## 2015-06-09 NOTE — Progress Notes (Signed)
Connie Richardson returns today for follow-up. Saw her 3 weeks ago. She's had recurrent symptoms or palpitations. There may be slight shortness of breath associated with this. I'm going to get a 2-D echo and event monitor. She may need empiric low-dose beta-blockade.

## 2015-06-09 NOTE — Patient Instructions (Signed)
Your Doctor has ordered you to wear a heart monitor. You will wear this for 30 days.   Your physician has requested that you have an echocardiogram ([address]). Echocardiography is a painless test that uses sound waves to create images of your heart. It provides your doctor with information about the size and shape of your heart and how well your heart's chambers and valves are working. This procedure takes approximately one hour. There are no restrictions for this procedure.  Your physician recommends that you schedule a follow-up appointment in 6 weeks with Dr. Gwenlyn Found   TIPS -  REMINDERS for monitor 1. The sensor is the lanyard that is worn around your neck every day - this is powered by a battery that needs to be changed every day 2. The monitor is the device that allows you to record symptoms - this will need to be charged daily 3. The sensor & monitor need to be within 100 feet of each other at all times 4. The sensor connects to the electrodes (stickers) - these should be changed every 24-48 hours (you do not have to remove them when you bathe, just make sure they are dry when you connect it back to the sensor 5. If you need more supplies (electrodes, batteries), please call the 1-800 # on the back of the pamphlet and [employer] will mail you more supplies 6. If your skin becomes sensitive, please try the sample pack of sensitive skin electrodes (the white packet in your silver box) and call [employer] to have them mail you more of these type of electrodes 7. When you are finish wearing the monitor, please place all supplies back in the silver box, place the silver box in the pre-packaged UPS bag and drop off at [employer] or call them so they can come pick it up   Cardiac Event Monitoring A cardiac event monitor is a small recording device used to help detect abnormal heart rhythms (arrhythmias). The monitor is used to record heart rhythm when noticeable symptoms such as the following  occur:  Fast heartbeats (palpitations), such as heart racing or fluttering.  Dizziness.  Fainting or light-headedness.  Unexplained weakness. The monitor is wired to two electrodes placed on your chest. Electrodes are flat, sticky disks that attach to your skin. The monitor can be worn for up to 30 days. You will wear the monitor at all times, except when bathing.  HOW TO USE YOUR CARDIAC EVENT MONITOR A technician will prepare your chest for the electrode placement. The technician will show you how to place the electrodes, how to work the monitor, and how to replace the batteries. Take time to practice using the monitor before you leave the office. Make sure you understand how to send the information from the monitor to your health care provider. This requires a telephone with a landline, not a cell phone. You need to:  Wear your monitor at all times, except when you are in water:  Do not get the monitor wet.  Take the monitor off when bathing. Do not swim or use a hot tub with it on.  Keep your skin clean. Do not put body lotion or moisturizer on your chest.  Change the electrodes daily or any time they stop sticking to your skin. You might need to use tape to keep them on.  It is possible that your skin under the electrodes could become irritated. To keep this from happening, try to put the electrodes in slightly  different places on your chest. However, they must remain in the area under your left breast and in the upper right section of your chest.  Make sure the monitor is safely clipped to your clothing or in a location close to your body that your health care provider recommends.  Press the button to record when you feel symptoms of heart trouble, such as dizziness, weakness, light-headedness, palpitations, thumping, shortness of breath, unexplained weakness, or a fluttering or racing heart. The monitor is always on and records what happened slightly before you pressed the button,  so do not worry about being too late to get good information.  Keep a diary of your activities, such as walking, doing chores, and taking medicine. It is especially important to note what you were doing when you pushed the button to record your symptoms. This will help your health care provider determine what might be contributing to your symptoms. The information stored in your monitor will be reviewed by your health care provider alongside your diary entries.  Send the recorded information as recommended by your health care provider. It is important to understand that it will take some time for your health care provider to process the results.  Change the batteries as recommended by your health care provider. SEEK IMMEDIATE MEDICAL CARE IF:   You have chest pain.  You have extreme difficulty breathing or shortness of breath.  You develop a very fast heartbeat that persists.  You develop dizziness that does not go away.  You faint or constantly feel you are about to faint. Document Released: 07/11/2008 Document Revised: 02/16/2014 Document Reviewed: 03/31/2013 [facility] Patient Information 2015 [place], [place]. This information is not intended to replace advice given to you by your health care provider. Make sure you discuss any questions you have with your health care provider.

## 2015-06-09 NOTE — Assessment & Plan Note (Signed)
Ms. Connie Richardson returns today after last being seen 3 weeks ago. Her palpitations have recurred and there are frequent and symptomatic. I'm going to get a 2-D echo and a third event monitor. I'll see back after that for further evaluation. She may need empiric low-dose beta-blockade.

## 2015-06-30 ENCOUNTER — Ambulatory Visit (HOSPITAL_COMMUNITY): Payer: 59 | Attending: Cardiology

## 2015-06-30 ENCOUNTER — Other Ambulatory Visit: Payer: Self-pay

## 2015-06-30 DIAGNOSIS — R002 Palpitations: Secondary | ICD-10-CM | POA: Insufficient documentation

## 2015-07-21 ENCOUNTER — Ambulatory Visit: Payer: 59 | Admitting: Cardiovascular Disease

## 2015-08-04 ENCOUNTER — Ambulatory Visit: Payer: 59 | Admitting: Cardiovascular Disease

## 2015-08-11 ENCOUNTER — Ambulatory Visit (INDEPENDENT_AMBULATORY_CARE_PROVIDER_SITE_OTHER): Payer: 59 | Admitting: Cardiovascular Disease

## 2015-08-11 ENCOUNTER — Encounter: Payer: Self-pay | Admitting: Cardiovascular Disease

## 2015-08-11 VITALS — BP 104/70 | HR 70 | Ht 64.0 in | Wt 161.0 lb

## 2015-08-11 DIAGNOSIS — R002 Palpitations: Secondary | ICD-10-CM | POA: Diagnosis not present

## 2015-08-11 NOTE — Patient Instructions (Signed)
Medication Instructions:  Your physician recommends that you continue on your current medications as directed. Please refer to the Current Medication list given to you today.   Labwork: none  Testing/Procedures: none  Follow-Up: Follow up with Dr. Berry as needed.   Any Other Special Instructions Will Be Listed Below (If Applicable).     If you need a refill on your cardiac medications before your next appointment, please call your pharmacy.   

## 2015-08-11 NOTE — Progress Notes (Signed)
Connie Richardson returns for follow-up of palpitations. Her 2-D echo was normal. Her monitor showed unifocal PVCs. She has eliminated caffeine from her diet. Her symptoms have resolved. I will see her back when necessary

## 2015-08-11 NOTE — Assessment & Plan Note (Signed)
Connie Richardson returns for follow-up of palpitations. Her 2-D echo was normal. Her monitor showed unifocal PVCs. She has eliminated caffeine from her diet. Her symptoms have resolved. I will see her back when necessary

## 2015-08-31 ENCOUNTER — Ambulatory Visit: Payer: 59 | Admitting: Cardiovascular Disease

## 2017-04-20 ENCOUNTER — Encounter: Payer: Self-pay | Admitting: Genetics

## 2017-04-20 ENCOUNTER — Telehealth: Payer: Self-pay | Admitting: Genetics

## 2017-04-20 NOTE — Telephone Encounter (Signed)
Genetic counseling appt has been scheduled for the pt to see Ria Comment on 7/23 at 1pm. Pt aware to arrive 30 minutes early. Demographics verified. Letter mailed to the pt and faxed to the referring.

## 2017-05-07 ENCOUNTER — Encounter: Payer: 59 | Admitting: Genetics

## 2017-05-07 ENCOUNTER — Other Ambulatory Visit: Payer: Self-pay

## 2017-05-16 ENCOUNTER — Encounter: Payer: Self-pay | Admitting: Genetics

## 2017-05-17 ENCOUNTER — Ambulatory Visit (HOSPITAL_BASED_OUTPATIENT_CLINIC_OR_DEPARTMENT_OTHER): Payer: Managed Care, Other (non HMO) | Admitting: Genetics

## 2017-05-17 ENCOUNTER — Other Ambulatory Visit: Payer: Managed Care, Other (non HMO)

## 2017-05-17 ENCOUNTER — Encounter: Payer: Self-pay | Admitting: Genetics

## 2017-05-17 DIAGNOSIS — Z8051 Family history of malignant neoplasm of kidney: Secondary | ICD-10-CM

## 2017-05-17 DIAGNOSIS — Z315 Encounter for genetic counseling: Secondary | ICD-10-CM | POA: Diagnosis not present

## 2017-05-17 DIAGNOSIS — Z808 Family history of malignant neoplasm of other organs or systems: Secondary | ICD-10-CM | POA: Diagnosis not present

## 2017-05-17 DIAGNOSIS — Z8371 Family history of colonic polyps: Secondary | ICD-10-CM | POA: Diagnosis not present

## 2017-05-17 DIAGNOSIS — Z800 Family history of malignant neoplasm of digestive organs: Secondary | ICD-10-CM | POA: Diagnosis not present

## 2017-05-17 NOTE — Progress Notes (Addendum)
REFERRING PROVIDER: Everlene Farrier, [place], [place] [address]  PRIMARY PROVIDER:  Aletha Halim., PA-C  PRIMARY REASON FOR VISIT:  1. Family history of colon cancer   2. Family history of brain cancer     HISTORY OF PRESENT ILLNESS:   Connie Richardson, a 36 y.o. female, was seen for a [place] cancer genetics consultation at the request of Dr. Gaetano Net due to a family history of cancer.  Connie Richardson presents to clinic today to discuss the possibility of a hereditary predisposition to cancer, genetic testing, and to further clarify her future cancer risks, as well as potential cancer risks for family members.    Connie Richardson is a 36 y.o. female with no personal history of cancer.    HORMONAL RISK FACTORS:  Menarche was at age 42.  First live birth at age 29.  OCP use for approximately 12 years. She had the Assure procedure.  Ovaries intact: yes.  Hysterectomy: no. Menopausal status: premenopausal.  HRT use: 0 years. Colonoscopy: no; not examined. Mammogram within the last year: 2017. Recommended to come back at age 89. Number of breast biopsies: 0. Up to date with pelvic exams:  Yes.  In the past she had some abnormal/precancerous cervical cells that were treated with a laser.  Her PAP smears have been normal for many years now.   Past Medical History:  Diagnosis Date  . Blood type O+   . Complication of anesthesia   . Constipation   . Family history of brain cancer   . Family history of colon cancer   . Palpitations    PVCs on event monitor  . PONV (postoperative nausea and vomiting)     Past Surgical History:  Procedure Laterality Date  . CERVIX LESION DESTRUCTION  1999   IN [place], PT REPORTED    Social History   Social History  . Marital status: Married    Spouse name: N/A  . Number of children: N/A  . Years of education: N/A   Social History Main Topics  . Smoking status: Never Smoker  . Smokeless tobacco: Never Used  .  Alcohol use 2.4 oz/week    4 Standard drinks or equivalent per week     Comment: MINIMAL  . Drug use: No  . Sexual activity: Yes    Birth control/ protection: None   Other Topics Concern  . None   Social History Narrative  . None     FAMILY HISTORY:  We obtained a detailed, 4-generation family history.  Significant diagnoses are listed below: Family History  Problem Relation Age of Onset  . Colon cancer Paternal Grandmother        dx. late 60's/early 70's. died in early 20's  . COPD Mother        died at 33  . Kidney cancer Father 65  . Colon polyps Father        has had many polyps, 1 very large one removed. 10+ polyps total  . Skin cancer Father        type unknown  . Rectal cancer Paternal Aunt 27       stage 4  . Brain cancer Paternal Grandfather 46       died 83  . Anesthesia problems Neg Hx    Connie Richardson has 3 daughters (ages 53, 39, and 50) and a son (age 56).  She has 2 maternal half-sisters.  One is 30 with no history of cancer.  She  has 2 sons (27, 66) and a daughter (29).  Her other half-sister was killed at the age of 25.  She had a son who is 30.  No history of cancer for these relatives.   Connie Richardson mother died at 18 due to COPD.  Very little information is known about her mother's family history.  Connie Richardson has 63 aunts and uncles with no known history of cancer, although little information is known. Connie Richardson maternal grandparents lived into their 59's/80's and had no known history of cancer.   Connie Richardson father had kidney cancer at 26.  He was also found to have pancreatitis at this time.  He has had many colon polyps in his lifetime.  Connie Richardson is unsure when he started having colon polyps found, but since he started colonoscopies there have been some every visit.  He gets colonoscopies every year now (and in the past has had colonoscopies every 6 months).  At the age of 61 he had surgery to remove part of his colon due to a large polyp being identified.   Connie Richardson does not know if his polyps have been precancerous or not.  He has had skin cancer in the past, but the type is unknown.   Connie Richardson does not believe they were sebaceous carcinomas'.  Connie Richardson has a 47 year-old paternal aunt who was diagnosed with stage 4 rectal cancer at 30. Connie Richardson has a 23 year-old paternal uncle with no known history of cancer or extensive polyps.  He has a son and a daughter int heir 33's with no history of cancer.  Connie Richardson paternal grandfather had colon cancer in her late 106's/early70's.  She died in her early 105's.  She had several siblings, some that may have had cancer, but the details are unknown.  Connie Richardson paternal grandfather was diagnosed with a brain tumor at 75 and died at 30.  He had a brother who died in his 53's and had an unknown type of cancer.    Connie Richardson is unaware of previous family history of genetic testing for hereditary cancer risks. Patient's maternal ancestors are of Irish/English descent, and paternal ancestors are of English/Hungarian descent. There is no reported Ashkenazi Jewish ancestry. There is no known consanguinity.  GENETIC COUNSELING ASSESSMENT: Connie Richardson is a 36 y.o. female with a family history which is somewhat suggestive of a Hereditary Cancer Predisposition syndrome. We, therefore, discussed and recommended the following at today's visit.   DISCUSSION: We reviewed the characteristics, features and inheritance patterns of hereditary cancer syndromes. We also discussed genetic testing, including the appropriate family members to test, the process of testing, insurance coverage and turn-around-time for results. We discussed the implications of a negative, positive and/or variant of uncertain significant result. We offered Connie Richardson the option to pursue genetic testing colon cancer and other cancer risk genes.    We discussed that only 5-10% of cancers are associated with a Hereditary Cancer Predisposition  Syndrome.  The most common hereditary cancer syndrome associated with colon cancer is Lynch Syndrome.  Lynch Syndrome is caused by mutations in the genes: MLH1, MSH2, MSH6, PMS2 and EPCAM.  This syndrome increases the risk for colon, uterine, ovarian and stomach cancers, as well as others. We also discussed that there are also genes that can cause an individual to develop large amounts of colon polyps.  There are also many other genes that have been associated with many other types of cancer risks.  We discussed that if she is found to have a mutation in one of these genes, it may impact future medical management recommendations such as increased cancer screenings and consideration of risk reducing surgeries.  A positive result could also have implications for the patient's family members.  A Negative result would mean we were unable to identify a hereditary component to her cancer, but does not rule out the possibility of a hereditary basis for her cancer.  There could be mutations that are undetectable by current technology, or in genes not yet tested or identified to increase cancer risk.    We discussed the potential to find a Variant of Uncertain Significance or VUS.  These are variants that have not yet been identified as pathogenic or benign, and it is unknown if this variant is associated with increased cancer risk or if this is a normal finding.  Most VUS's are reclassified to benign or likely benign.   It should not be used to make medical management decisions. With time, we suspect the lab will determine the significance of any VUS's identified if any.   Based on Connie Richardson. Degroote's family history of cancer, she may meet medical criteria for genetic testing. Without knowing more specific details about her father's polyp and kidney cancer history it is difficult to determine if she meets clinical criteria and if this test would be covered by her insurance.  We discussed that her father or paternal aunt  would be the most informative individuals to undergo genetic testing because they have been affected with cancer/a significant polyp history. However, if they decline testing or Connie Richardson. Aki would like to proceed with genetic testing we would be happy to assist with coordinating this testing.    In order to estimate her chance of having a Lynch Syndrome gene mutation, we used statistical model (PREMM5) hat take into account her personal medical history, family history and ancestry.  Because each model is different, there can be a lot of variability in the risks they give.  Therefore, these numbers must be considered a rough estimate, and not a precise risk of having a Lynch Syndrome gene mutation.  These models estimate that she has approximately a 1.7% chance of having a mutation. Based on this assessment of her family and personal history, genetic testing is not recommended.  Colon Cancer screening for Connie Richardson. Devinney should be based on her family history.  Specifically, her father's colon polyp history could influence her colon cancer screening recommendations.  Connie Richardson. Colvard was did not know the age her of her father's first polyp or if any polyps were adenomatous and his records were not available for review.  The current NCCN guidelines regarding family history of advanced adenomatous polyps is listed below:      PLAN: After discussing the benefits and limitations to genetic testing, Connie Richardson Howze decided to decline genetic testing today.  She will discuss testing with her father to see if he would be willing to test as his genetic testing would be more informative for the family. She will let us known if she would like to proceed with genetic testing at a later time, and we would be happy to coordinate that testing.  We encouraged Connie Richardson. Arif to remain in contact with cancer genetics and alert [place] to any updates in her family history/genetic testing decision. Connie Richardson. Balcom questions were answered to her satisfaction  today. Our contact information was provided should additional questions or concerns arise.  Based on Connie Richardson. Ruminski's family history,  we recommended her father, who was diagnosed with kidney cancer at age 19 and who has a significant colon polyp history, have genetic counseling and testing. Connie Richardson. Furnari will let us know if we can be of any assistance in coordinating genetic counseling and/or testing for this family member.   Lastly, we encouraged Connie Richardson. Oldenburg to remain in contact with cancer genetics annually so that we can continuously update the family history and inform her of any changes in cancer genetics and testing that may be of benefit for this family.   Connie Richardson.  Woerner questions were answered to her satisfaction today. Our contact information was provided should additional questions or concerns arise. Thank you for the referral and allowing [place] to share in the care of your patient.   Tana Felts, Connie Richardson Genetic Counselor [email or web address] phone: [phone]  The patient was seen for a total of 40 minutes in face-to-face genetic counseling.

## 2017-11-15 DIAGNOSIS — H6980 Other specified disorders of Eustachian tube, unspecified ear: Secondary | ICD-10-CM | POA: Diagnosis not present

## 2017-12-17 DIAGNOSIS — J0110 Acute frontal sinusitis, unspecified: Secondary | ICD-10-CM | POA: Diagnosis not present

## 2017-12-17 DIAGNOSIS — H6983 Other specified disorders of Eustachian tube, bilateral: Secondary | ICD-10-CM | POA: Diagnosis not present

## 2018-01-12 DIAGNOSIS — R51 Headache: Secondary | ICD-10-CM | POA: Diagnosis not present

## 2018-01-12 DIAGNOSIS — R000 Tachycardia, unspecified: Secondary | ICD-10-CM | POA: Insufficient documentation

## 2018-01-12 DIAGNOSIS — A084 Viral intestinal infection, unspecified: Secondary | ICD-10-CM | POA: Diagnosis not present

## 2018-01-12 DIAGNOSIS — R112 Nausea with vomiting, unspecified: Secondary | ICD-10-CM | POA: Diagnosis not present

## 2018-01-13 ENCOUNTER — Emergency Department (HOSPITAL_COMMUNITY)
Admission: EM | Admit: 2018-01-13 | Discharge: 2018-01-13 | Disposition: A | Discharge status: Home / Self Care | Payer: 59 | Attending: Emergency Medicine | Admitting: Emergency Medicine

## 2018-01-13 ENCOUNTER — Encounter (HOSPITAL_COMMUNITY): Payer: Self-pay | Admitting: Nurse Practitioner

## 2018-01-13 DIAGNOSIS — A084 Viral intestinal infection, unspecified: Secondary | ICD-10-CM

## 2018-01-13 LAB — URINALYSIS, ROUTINE W REFLEX MICROSCOPIC
BILIRUBIN URINE: NEGATIVE
Glucose, UA: NEGATIVE mg/dL
HGB URINE DIPSTICK: NEGATIVE
Ketones, ur: 5 mg/dL — AB
Leukocytes, UA: NEGATIVE
NITRITE: NEGATIVE
PROTEIN: NEGATIVE mg/dL
SPECIFIC GRAVITY, URINE: 1.02 (ref 1.005–1.030)
pH: 6 (ref 5.0–8.0)

## 2018-01-13 LAB — CBC
HEMATOCRIT: 41.1 % (ref 36.0–46.0)
Hemoglobin: 13.6 g/dL (ref 12.0–15.0)
MCH: 32.8 pg (ref 26.0–34.0)
MCHC: 33.1 g/dL (ref 30.0–36.0)
MCV: 99 fL (ref 78.0–100.0)
PLATELETS: 223 10*3/uL (ref 150–400)
RBC: 4.15 MIL/uL (ref 3.87–5.11)
RDW: 12.6 % (ref 11.5–15.5)
WBC: 10.1 10*3/uL (ref 4.0–10.5)

## 2018-01-13 LAB — COMPREHENSIVE METABOLIC PANEL
ALBUMIN: 4.2 g/dL (ref 3.5–5.0)
ALT: 25 U/L (ref 14–54)
AST: 29 U/L (ref 15–41)
Alkaline Phosphatase: 36 U/L — ABNORMAL LOW (ref 38–126)
Anion gap: 11 (ref 5–15)
BUN: 17 mg/dL (ref 6–20)
CO2: 21 mmol/L — ABNORMAL LOW (ref 22–32)
Calcium: 8.8 mg/dL — ABNORMAL LOW (ref 8.9–10.3)
Chloride: 106 mmol/L (ref 101–111)
Creatinine, Ser: 0.59 mg/dL (ref 0.44–1.00)
GFR calc Af Amer: >60 mL/min (ref >60)
GFR calc non Af Amer: >60 mL/min (ref >60)
GLUCOSE: 101 mg/dL — AB (ref 65–99)
POTASSIUM: 4 mmol/L (ref 3.5–5.1)
Sodium: 138 mmol/L (ref 135–145)
Total Bilirubin: 0.8 mg/dL (ref 0.3–1.2)
Total Protein: 6.9 g/dL (ref 6.5–8.1)

## 2018-01-13 LAB — LIPASE, BLOOD: LIPASE: 24 U/L (ref 11–51)

## 2018-01-13 LAB — I-STAT BETA HCG BLOOD, ED (MC, WL, AP ONLY): I-stat hCG, quantitative: <5 m[IU]/mL (ref <5)

## 2018-01-13 MED ORDER — ONDANSETRON 8 MG PO TBDP: 8.0000 mg | ORAL_TABLET | Freq: Three times a day (TID) | ORAL | 0 refills | Status: AC | PRN

## 2018-01-13 MED ORDER — KETOROLAC TROMETHAMINE 15 MG/ML IJ SOLN
15.0000 mg | Freq: Once | INTRAMUSCULAR | Status: AC
  Administered 2018-01-13: 15 mg via INTRAVENOUS
  Filled 2018-01-13: qty 1

## 2018-01-13 MED ORDER — ONDANSETRON HCL 4 MG/2ML IJ SOLN
4.0000 mg | Freq: Once | INTRAMUSCULAR | Status: AC
  Administered 2018-01-13: 4 mg via INTRAVENOUS
  Filled 2018-01-13: qty 2

## 2018-01-13 MED ORDER — SODIUM CHLORIDE 0.9 % IV BOLUS
2000.0000 mL | Freq: Once | INTRAVENOUS | Status: AC
  Administered 2018-01-13: 2000 mL via INTRAVENOUS

## 2018-01-13 MED ORDER — SODIUM CHLORIDE 0.9 % IV BOLUS
1000.0000 mL | Freq: Once | INTRAVENOUS | Status: AC
  Administered 2018-01-13: 1000 mL via INTRAVENOUS

## 2018-01-13 NOTE — ED Triage Notes (Signed)
Pt is c/o severe abdominal pain radiating to the back, associated with N/V/D, reports onset of today.

## 2018-01-13 NOTE — ED Provider Notes (Signed)
[facility] DEPT Provider Note: Georgena Spurling, MD, FACEP  CSN: [id number] MRN: [id number] ARRIVAL: 01/12/18 at 2215 ROOM: [place]  Abdominal Pain and N/V/D   HISTORY OF PRESENT ILLNESS  01/13/18 3:19 AM Connie Richardson is a 37 y.o. female who developed abdominal pain beginning about 3 PM yesterday afternoon.  She describes the pain as generalized with dull and crampy components.  It was severe at its worst but has improved over the past several hours.  She rated it as a 10 out of 10 at its worst about a 6 out of 10 presently.  It has been associated with profuse nausea and vomiting as well as diarrhea.  She was noted to be tachycardic on arrival and is complaining of a dry mouth and believes she is dehydrated.  She is complaining of a headache as well.   Past Medical History:  Diagnosis Date  . Constipation   . Palpitations    PVCs on event monitor  . PONV (postoperative nausea and vomiting)     Past Surgical History:  Procedure Laterality Date  . CERVIX LESION DESTRUCTION  1999   IN [place], PT REPORTED    Family History  Problem Relation Age of Onset  . Colon cancer Paternal Grandmother        dx. late 60's/early 70's. died in early 26's  . COPD Mother        died at 22  . Kidney cancer Father 28  . Colon polyps Father        has had many polyps, 1 very large one removed. 10+ polyps total  . Skin cancer Father        type unknown  . Rectal cancer Paternal Aunt 53       stage 4  . Brain cancer Paternal Grandfather 23       died 82  . Anesthesia problems Neg Hx     Social History   Tobacco Use  . Smoking status: Never Smoker  . Smokeless tobacco: Never Used  Substance Use Topics  . Alcohol use: Yes    Alcohol/week: 2.4 oz    Types: 4 Standard drinks or equivalent per week    Comment: MINIMAL  . Drug use: No    Prior to Admission medications   Medication Sig Start Date End Date Taking? Authorizing Provider  dimenhyDRINATE  (DRAMAMINE) 50 MG tablet Take 50 mg by mouth every 8 (eight) hours as needed for nausea.   Yes [provider]  ibuprofen (ADVIL,MOTRIN) 200 MG tablet Take 400 mg by mouth every 6 (six) hours as needed for headache.   Yes [provider]    Allergies Patient has no known allergies.   REVIEW OF SYSTEMS  Negative except as noted here or in the History of Present Illness.   PHYSICAL EXAMINATION  Initial Vital Signs Blood pressure 103/67, pulse (!) 102, temperature 98.5 F (36.9 C), temperature source Oral, resp. rate 18, SpO2 100 %.  Examination General: Well-developed, well-nourished female in no acute distress; appearance consistent with age of record HENT: normocephalic; atraumatic; mucous membranes dry Eyes: pupils equal, round and reactive to light; extraocular muscles intact Neck: supple Heart: regular rate and rhythm; tachycardia Lungs: clear to auscultation bilaterally Abdomen: soft; nondistended; diffusely tender most prominently in the epigastrium; no masses or hepatosplenomegaly; bowel sounds present Extremities: No deformity; full range of motion; pulses normal Neurologic: Awake, alert and oriented; motor function intact in all extremities and symmetric; no facial droop Skin: Warm  and dry Psychiatric: Normal mood and affect   RESULTS  Summary of this visit's results, reviewed by myself:   EKG Interpretation  Date/Time:    Ventricular Rate:    PR Interval:    QRS Duration:   QT Interval:    QTC Calculation:   R Axis:     Text Interpretation:        Laboratory Studies: Results for orders placed or performed during the hospital encounter of 01/13/18 (from the past 24 hour(s))  Lipase, blood     Status: None   Collection Time: 01/13/18 12:33 AM  Result Value Ref Range   Lipase 24 11 - 51 U/L  Comprehensive metabolic panel     Status: Abnormal   Collection Time: 01/13/18 12:33 AM  Result Value Ref Range   Sodium 138 135 - 145 mmol/L    Potassium 4.0 3.5 - 5.1 mmol/L   Chloride 106 101 - 111 mmol/L   CO2 21 (L) 22 - 32 mmol/L   Glucose, Bld 101 (H) 65 - 99 mg/dL   BUN 17 6 - 20 mg/dL   Creatinine, Ser 0.59 0.44 - 1.00 mg/dL   Calcium 8.8 (L) 8.9 - 10.3 mg/dL   Total Protein 6.9 6.5 - 8.1 g/dL   Albumin 4.2 3.5 - 5.0 g/dL   AST 29 15 - 41 U/L   ALT 25 14 - 54 U/L   Alkaline Phosphatase 36 (L) 38 - 126 U/L   Total Bilirubin 0.8 0.3 - 1.2 mg/dL   GFR calc non Af Amer >60 >60 mL/min   GFR calc Af Amer >60 >60 mL/min   Anion gap 11 5 - 15  CBC     Status: None   Collection Time: 01/13/18 12:33 AM  Result Value Ref Range   WBC 10.1 4.0 - 10.5 K/uL   RBC 4.15 3.87 - 5.11 MIL/uL   Hemoglobin 13.6 12.0 - 15.0 g/dL   HCT 41.1 36.0 - 46.0 %   MCV 99.0 78.0 - 100.0 fL   MCH 32.8 26.0 - 34.0 pg   MCHC 33.1 30.0 - 36.0 g/dL   RDW 12.6 11.5 - 15.5 %   Platelets 223 150 - 400 K/uL  I-Stat beta hCG blood, ED     Status: None   Collection Time: 01/13/18 12:36 AM  Result Value Ref Range   I-stat hCG, quantitative <5.0 <5 mIU/mL   Comment 3          Urinalysis, Routine w reflex microscopic     Status: Abnormal   Collection Time: 01/13/18  5:45 AM  Result Value Ref Range   Color, Urine YELLOW YELLOW   APPearance CLEAR CLEAR   Specific Gravity, Urine 1.020 1.005 - 1.030   pH 6.0 5.0 - 8.0   Glucose, UA NEGATIVE NEGATIVE mg/dL   Hgb urine dipstick NEGATIVE NEGATIVE   Bilirubin Urine NEGATIVE NEGATIVE   Ketones, ur 5 (A) NEGATIVE mg/dL   Protein, ur NEGATIVE NEGATIVE mg/dL   Nitrite NEGATIVE NEGATIVE   Leukocytes, UA NEGATIVE NEGATIVE   Imaging Studies: No results found.  ED COURSE  Nursing notes and initial vitals signs, including pulse oximetry, reviewed.  Vitals:   01/13/18 0345 01/13/18 0400 01/13/18 0415 01/13/18 0433  BP:  98/68  98/70  Pulse: 92 92 91 98  Resp:    18  Temp:      TempSrc:      SpO2: 97% 98% 100% 100%   3:42 AM IV fluid bolus and Zofran ordered.  Patient  declines analgesics at this  time.  6:16 AM Patient's urinalysis shows a specific gravity 1.020 after 2 L of normal saline IV.  This is not consistent with dehydration.  She was lightheaded on arrival but this is improved with hydration.  She is getting 1/3 L at this time.  Her blood pressures are still running in the 76R systolic but she has a history of low blood pressures in the past.  Symptoms are consistent with a viral gastroenteritis.  PROCEDURES    ED DIAGNOSES     ICD-10-CM   1. Viral gastroenteritis A08.4        Rashida Ladouceur, Jenny Reichmann, MD 01/13/18 [phone]

## 2018-01-13 NOTE — ED Notes (Signed)
Patient given Sprite to drink per provider.

## 2018-01-13 NOTE — ED Notes (Signed)
PT aware of urine sample 

## 2018-01-24 DIAGNOSIS — R42 Dizziness and giddiness: Secondary | ICD-10-CM | POA: Diagnosis not present

## 2018-03-11 DIAGNOSIS — N390 Urinary tract infection, site not specified: Secondary | ICD-10-CM | POA: Diagnosis not present

## 2018-03-14 DIAGNOSIS — Z01419 Encounter for gynecological examination (general) (routine) without abnormal findings: Secondary | ICD-10-CM | POA: Diagnosis not present

## 2018-08-20 DIAGNOSIS — Z13220 Encounter for screening for lipoid disorders: Secondary | ICD-10-CM | POA: Diagnosis not present

## 2018-08-20 DIAGNOSIS — Z0000 Encounter for general adult medical examination without abnormal findings: Secondary | ICD-10-CM | POA: Diagnosis not present

## 2018-10-17 DIAGNOSIS — D2261 Melanocytic nevi of right upper limb, including shoulder: Secondary | ICD-10-CM | POA: Diagnosis not present

## 2018-10-17 DIAGNOSIS — D225 Melanocytic nevi of trunk: Secondary | ICD-10-CM | POA: Diagnosis not present

## 2018-10-17 DIAGNOSIS — D2262 Melanocytic nevi of left upper limb, including shoulder: Secondary | ICD-10-CM | POA: Diagnosis not present

## 2019-03-24 ENCOUNTER — Other Ambulatory Visit: Payer: Self-pay | Admitting: Obstetrics and Gynecology

## 2019-03-24 DIAGNOSIS — R928 Other abnormal and inconclusive findings on diagnostic imaging of breast: Secondary | ICD-10-CM

## 2019-03-27 ENCOUNTER — Other Ambulatory Visit: Payer: Self-pay

## 2019-03-27 ENCOUNTER — Ambulatory Visit: Payer: 59

## 2019-03-27 ENCOUNTER — Ambulatory Visit
Admission: RE | Admit: 2019-03-27 | Discharge: 2019-03-27 | Disposition: A | Discharge status: Home / Self Care | Payer: 59 | Source: Ambulatory Visit | Attending: Obstetrics and Gynecology | Admitting: Obstetrics and Gynecology

## 2019-03-27 DIAGNOSIS — R928 Other abnormal and inconclusive findings on diagnostic imaging of breast: Secondary | ICD-10-CM

## 2019-08-19 IMAGING — [facility] DIGITAL DIAGNOSTIC UNILATERAL LEFT MAMMOGRAM WITH TOMO AND CAD
4 series · 4 of 12 positions shown · non-contrast
Comparison: Previous exam(s).

CLINICAL DATA: Screening recall for possible asymmetry in the left
breast.

EXAM:
DIGITAL DIAGNOSTIC UNILATERAL LEFT MAMMOGRAM WITH CAD AND TOMO

[L MLO synth-2D]
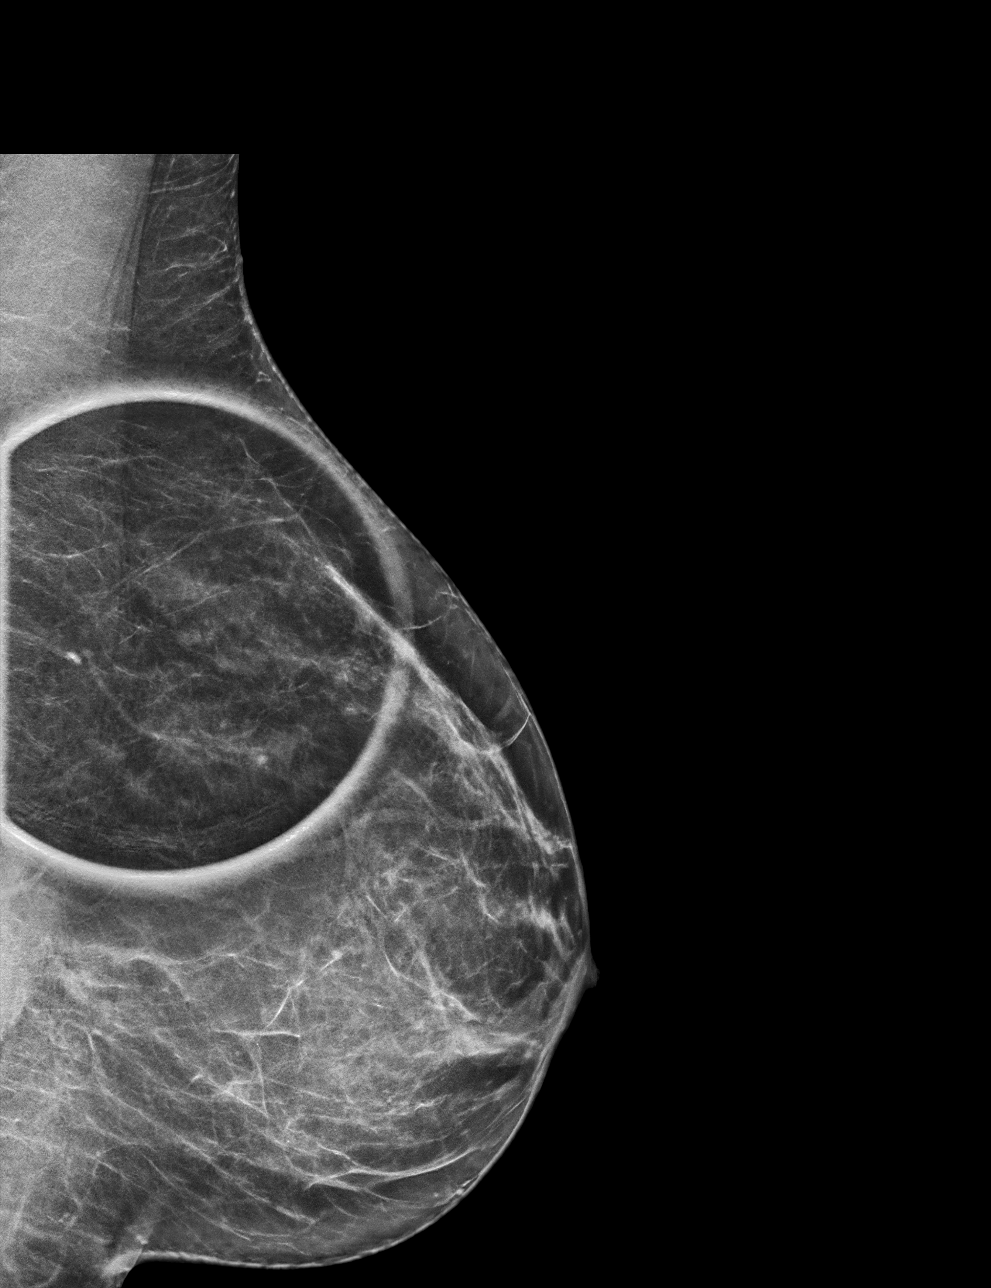

[L CC synth-2D]
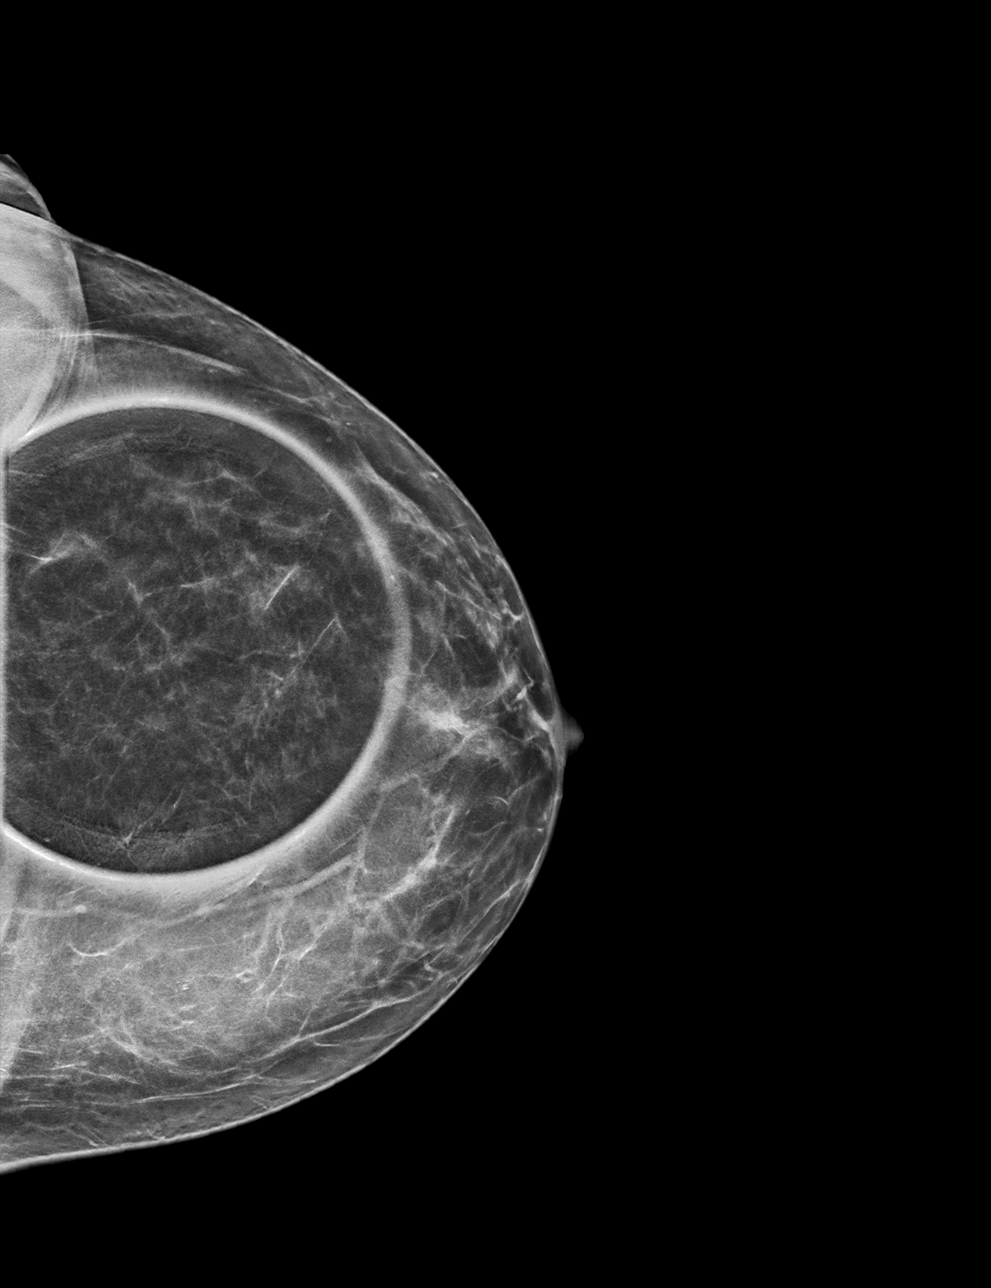

[L CC tomo · tomo slice 25/50.0]
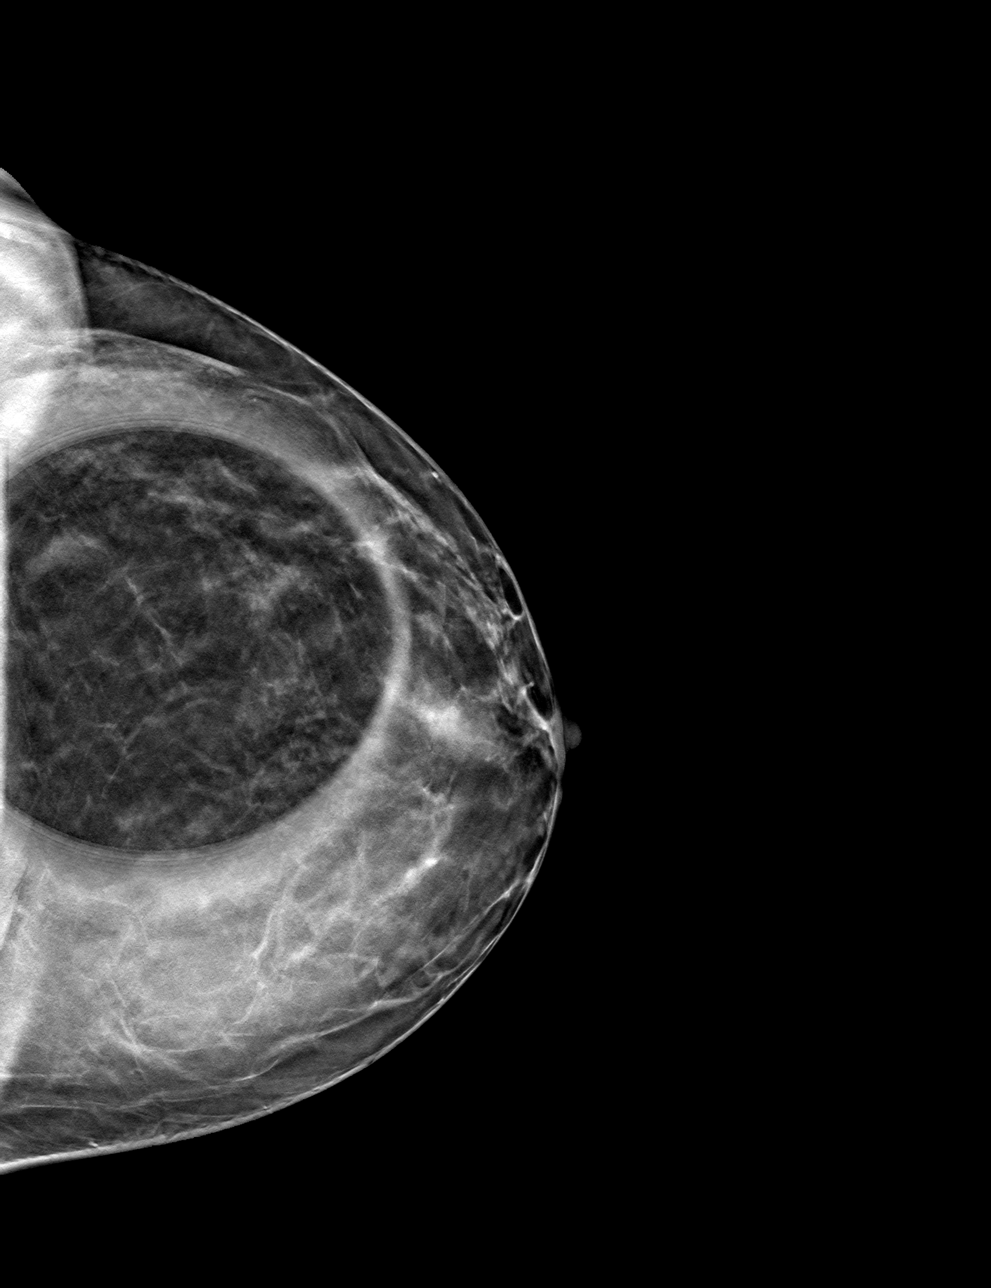

[L MLO tomo · tomo slice 27/53.0]
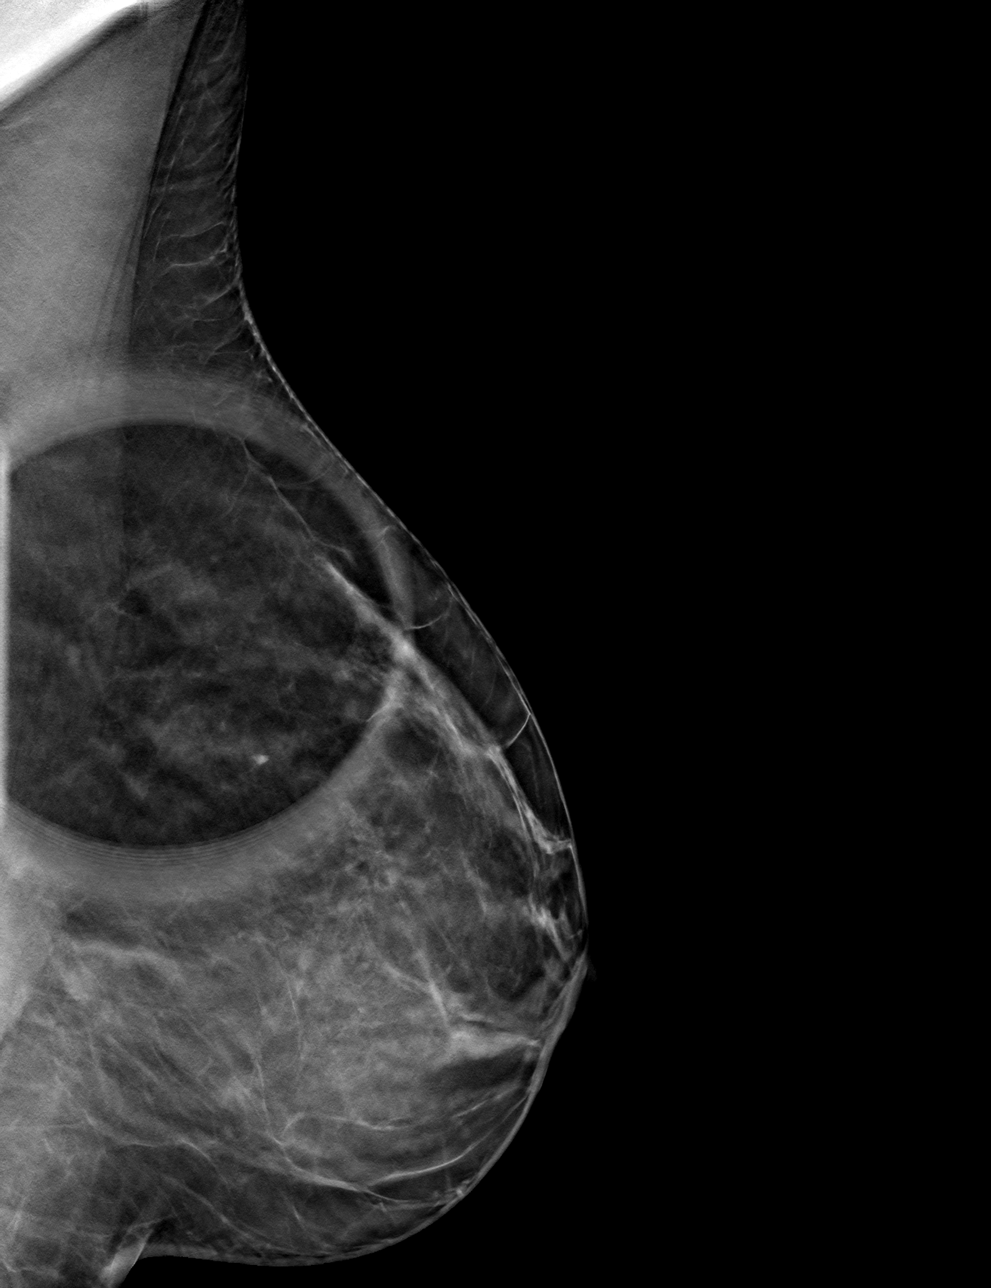

[4 of 12 positions shown; findings below may reference images not displayed]

ACR Breast Density Category b: There are scattered areas of
fibroglandular density.
FINDINGS: Possible asymmetry disperses on the diagnostic spot-compression
images consistent with normal fibroglandular tissue. There is no
underlying mass or significant residual asymmetry. There is no
architectural distortion and there are no suspicious calcifications.

Mammographic images were processed with CAD.
IMPRESSION: Negative.  No evidence of breast malignancy.

RECOMMENDATION:
Screening mammogram in one year.(Code:[id number])

I have discussed the findings and recommendations with the patient.
Results were also provided in writing at the conclusion of the
visit. If applicable, a reminder letter will be sent to the patient
regarding the next appointment.

BI-RADS CATEGORY  1: Negative.

## 2024-08-20 ENCOUNTER — Other Ambulatory Visit: Payer: Self-pay | Admitting: Obstetrics and Gynecology

## 2024-08-20 DIAGNOSIS — R928 Other abnormal and inconclusive findings on diagnostic imaging of breast: Secondary | ICD-10-CM

## 2024-08-30 ENCOUNTER — Ambulatory Visit
Admission: RE | Admit: 2024-08-30 | Discharge: 2024-08-30 | Disposition: A | Discharge status: Home / Self Care | Payer: Self-pay | Source: Ambulatory Visit | Attending: Obstetrics and Gynecology | Admitting: Obstetrics and Gynecology

## 2024-08-30 DIAGNOSIS — R928 Other abnormal and inconclusive findings on diagnostic imaging of breast: Secondary | ICD-10-CM
